# Patient Record
Sex: Male | Born: 1965 | Race: White | Hispanic: No | Marital: Married | Smoking: Never smoker
Health system: Southern US, Community
[De-identification: ages and names within clinical notes are randomized; demographics above are authoritative.]

## PROBLEM LIST (undated history)

## (undated) DIAGNOSIS — E079 Disorder of thyroid, unspecified: Secondary | ICD-10-CM

## (undated) DIAGNOSIS — I10 Essential (primary) hypertension: Secondary | ICD-10-CM

## (undated) DIAGNOSIS — C801 Malignant (primary) neoplasm, unspecified: Secondary | ICD-10-CM

---

## 2009-03-21 ENCOUNTER — Emergency Department (HOSPITAL_COMMUNITY): Admission: EM | Admit: 2009-03-21 | Discharge: 2009-03-21 | Payer: Self-pay | Admitting: Emergency Medicine

## 2020-07-13 ENCOUNTER — Other Ambulatory Visit (HOSPITAL_BASED_OUTPATIENT_CLINIC_OR_DEPARTMENT_OTHER): Payer: Self-pay

## 2020-09-22 ENCOUNTER — Encounter (HOSPITAL_COMMUNITY): Payer: Self-pay | Admitting: Emergency Medicine

## 2020-09-22 ENCOUNTER — Other Ambulatory Visit: Payer: Self-pay

## 2020-09-22 ENCOUNTER — Observation Stay (HOSPITAL_COMMUNITY)
Admission: EM | Admit: 2020-09-22 | Discharge: 2020-09-23 | Disposition: A | Discharge status: Home / Self Care | Payer: BC Managed Care – PPO | Attending: General Surgery | Admitting: General Surgery

## 2020-09-22 ENCOUNTER — Emergency Department (HOSPITAL_COMMUNITY): Payer: BC Managed Care – PPO

## 2020-09-22 DIAGNOSIS — J939 Pneumothorax, unspecified: Secondary | ICD-10-CM

## 2020-09-22 DIAGNOSIS — Z859 Personal history of malignant neoplasm, unspecified: Secondary | ICD-10-CM | POA: Insufficient documentation

## 2020-09-22 DIAGNOSIS — S2241XA Multiple fractures of ribs, right side, initial encounter for closed fracture: Secondary | ICD-10-CM | POA: Diagnosis not present

## 2020-09-22 DIAGNOSIS — I10 Essential (primary) hypertension: Secondary | ICD-10-CM | POA: Diagnosis not present

## 2020-09-22 DIAGNOSIS — Y92410 Unspecified street and highway as the place of occurrence of the external cause: Secondary | ICD-10-CM | POA: Insufficient documentation

## 2020-09-22 DIAGNOSIS — S299XXA Unspecified injury of thorax, initial encounter: Secondary | ICD-10-CM | POA: Diagnosis present

## 2020-09-22 HISTORY — DX: Essential (primary) hypertension: I10

## 2020-09-22 HISTORY — DX: Disorder of thyroid, unspecified: E07.9

## 2020-09-22 HISTORY — DX: Malignant (primary) neoplasm, unspecified: C80.1

## 2020-09-22 LAB — COMPREHENSIVE METABOLIC PANEL
ALT: 76 U/L — ABNORMAL HIGH (ref 0–44)
AST: 72 U/L — ABNORMAL HIGH (ref 15–41)
Albumin: 4.4 g/dL (ref 3.5–5.0)
Alkaline Phosphatase: 93 U/L (ref 38–126)
Anion gap: 8 (ref 5–15)
BUN: 13 mg/dL (ref 6–20)
CO2: 26 mmol/L (ref 22–32)
Calcium: 9.4 mg/dL (ref 8.9–10.3)
Chloride: 101 mmol/L (ref 98–111)
Creatinine, Ser: 1.07 mg/dL (ref 0.61–1.24)
GFR, Estimated: >60 mL/min (ref >60)
Glucose, Bld: 158 mg/dL — ABNORMAL HIGH (ref 70–99)
Potassium: 4.6 mmol/L (ref 3.5–5.1)
Sodium: 135 mmol/L (ref 135–145)
Total Bilirubin: 0.7 mg/dL (ref 0.3–1.2)
Total Protein: 7.2 g/dL (ref 6.5–8.1)

## 2020-09-22 LAB — CBC WITH DIFFERENTIAL/PLATELET
Abs Immature Granulocytes: 0.09 10*3/uL — ABNORMAL HIGH (ref 0.00–0.07)
Basophils Absolute: 0 10*3/uL (ref 0.0–0.1)
Basophils Relative: 0 %
Eosinophils Absolute: 0 10*3/uL (ref 0.0–0.5)
Eosinophils Relative: 0 %
HCT: 45.3 % (ref 39.0–52.0)
Hemoglobin: 15.6 g/dL (ref 13.0–17.0)
Immature Granulocytes: 1 %
Lymphocytes Relative: 5 %
Lymphs Abs: 0.7 10*3/uL (ref 0.7–4.0)
MCH: 30.4 pg (ref 26.0–34.0)
MCHC: 34.4 g/dL (ref 30.0–36.0)
MCV: 88.1 fL (ref 80.0–100.0)
Monocytes Absolute: 0.6 10*3/uL (ref 0.1–1.0)
Monocytes Relative: 4 %
Neutro Abs: 13.7 10*3/uL — ABNORMAL HIGH (ref 1.7–7.7)
Neutrophils Relative %: 90 %
Platelets: 192 10*3/uL (ref 150–400)
RBC: 5.14 MIL/uL (ref 4.22–5.81)
RDW: 13 % (ref 11.5–15.5)
WBC: 15.1 10*3/uL — ABNORMAL HIGH (ref 4.0–10.5)
nRBC: 0 % (ref 0.0–0.2)

## 2020-09-22 MED ORDER — ENOXAPARIN SODIUM 30 MG/0.3ML IJ SOSY: 30.0000 mg | PREFILLED_SYRINGE | Freq: Two times a day (BID) | INTRAMUSCULAR | Status: DC

## 2020-09-22 MED ORDER — ONDANSETRON 4 MG PO TBDP: 4.0000 mg | ORAL_TABLET | Freq: Four times a day (QID) | ORAL | Status: DC | PRN

## 2020-09-22 MED ORDER — ONDANSETRON HCL 4 MG/2ML IJ SOLN: 4.0000 mg | Freq: Four times a day (QID) | INTRAMUSCULAR | Status: DC | PRN

## 2020-09-22 MED ORDER — HYDROCODONE-ACETAMINOPHEN 5-325 MG PO TABS
2.0000 | ORAL_TABLET | ORAL | Status: DC | PRN
  Administered 2020-09-23: 2 via ORAL
  Filled 2020-09-22: qty 2
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
## 2020-09-22 NOTE — Progress Notes (Signed)
TRN in to round on new trauma admit. Patient reports no pain "as long as I don't move" denies shortness of breath. CAGE AID completed, see progress note. Teach back education on incentive spirometer, patient reports he has used one in the past. Reached 1000 with me.
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
## 2020-09-23 ENCOUNTER — Observation Stay (HOSPITAL_COMMUNITY): Payer: BC Managed Care – PPO

## 2020-09-23 MED ORDER — HYDROCODONE-ACETAMINOPHEN 5-325 MG PO TABS: 2.0000 | ORAL_TABLET | ORAL | 0 refills | Status: AC | PRN
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
## 2021-09-22 IMAGING — DX DG CHEST 1V PORT
1 series · 1 of 1 positions shown · non-contrast
Comparison: 09/22/2020

CLINICAL DATA: Follow-up pneumothorax.

EXAM:
PORTABLE CHEST 1 VIEW

[chest]
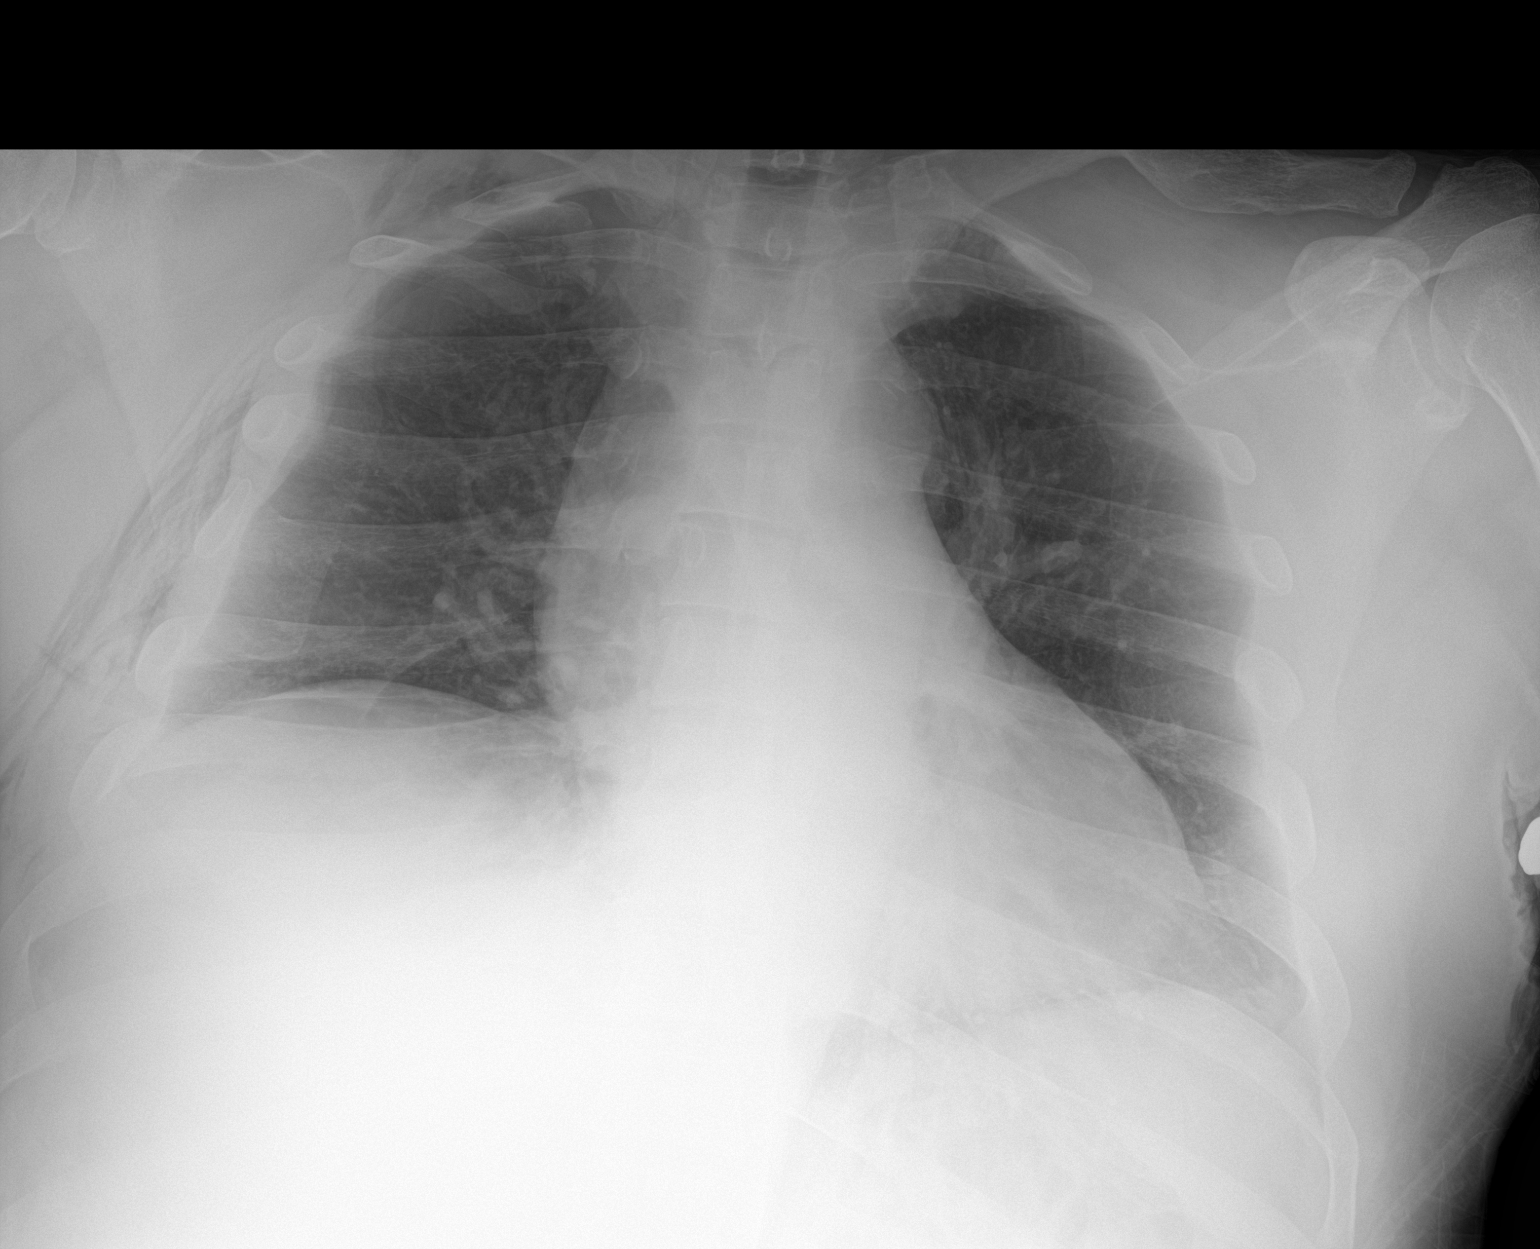

[1 of 1 positions shown; findings below may reference images not displayed]

FINDINGS: Stable cardiomediastinal contours. Small right apical pneumothorax
measures around 1.4 cm in thickness. This is compared with 1.9 cm
previously. Gas is again noted within the subcutaneous soft tissues
of the right chest wall. Asymmetric elevation of right hemidiaphragm
is unchanged. Stable right rib fractures. Remote healed midshaft
left clavicle fracture.
IMPRESSION: 1. Slight decrease in thickness of small right apical pneumothorax.
2. Right rib fractures.
